# Patient Record
Sex: Male | Born: 1990 | Race: White | Hispanic: No | Marital: Single | Smoking: Never smoker
Health system: Southern US, Community
[De-identification: ages and names within clinical notes are randomized; demographics above are authoritative.]

## PROBLEM LIST (undated history)

## (undated) DIAGNOSIS — J45909 Unspecified asthma, uncomplicated: Secondary | ICD-10-CM

## (undated) HISTORY — DX: Unspecified asthma, uncomplicated: J45.909

---
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
## 2012-06-01 ENCOUNTER — Ambulatory Visit: Payer: BC Managed Care – PPO | Admitting: Emergency Medicine

## 2012-06-01 ENCOUNTER — Ambulatory Visit: Payer: BC Managed Care – PPO

## 2012-06-01 VITALS — BP 120/86 | HR 69 | Temp 98.3°F | Resp 16 | Ht 73.0 in | Wt 263.0 lb

## 2012-06-01 DIAGNOSIS — R079 Chest pain, unspecified: Secondary | ICD-10-CM

## 2012-06-01 DIAGNOSIS — J45909 Unspecified asthma, uncomplicated: Secondary | ICD-10-CM

## 2012-06-01 NOTE — Progress Notes (Signed)
[facility] [address], [place] [address] [phone]  Date:  06/01/2012   Name:  Johnny Butler   DOB:  Apr 01, 1991   MRN:  [id number]  PCP:  No primary provider on file.    Chief Complaint: tightness right side of throat and mid chest and Asthma   History of Present Illness:  Johnny Butler is a 21 y.o. very pleasant male patient who presents with the following:  Two days ago laying on the couch took a nap.  When he awakened, his throat was sore on the right side.  The pain migrated into his right anterior chest.  Two weeks ago was unable to stop wheezing with an MDI and gave himself a neb treatment.  Has a non productive cough.  No SOB, wheezing, nasal congestion, drip or drainage.  No fever or chills.  Says chest pain is pleuritic in nature.  No risk factors for PE; travel, immobilization.  Non smoker.  No unusual exertion.  There is no problem list on file for this patient.   No past medical history on file.  No past surgical history on file.  History  Substance Use Topics  . Smoking status: Never Smoker   . Smokeless tobacco: Not on file  . Alcohol Use: Yes    No family history on file.  No Known Allergies  Medication list has been reviewed and updated.  Current Outpatient Prescriptions on File Prior to Visit  Medication Sig Dispense Refill  . amphetamine-dextroamphetamine (ADDERALL) 10 MG tablet Take 10 mg by mouth 2 (two) times daily.        Review of Systems:  As per HPI, otherwise negative.    Physical Examination: Filed Vitals:   06/01/12 0947  BP: 120/86  Pulse: 69  Temp: 98.3 F (36.8 C)  Resp: 16   Filed Vitals:   06/01/12 0947  Height: 6\' 1"  (1.854 m)  Weight: 263 lb (119.296 kg)   Body mass index is 34.70 kg/(m^2). Ideal Body Weight: Weight in (lb) to have BMI = 25: 189.1   GEN: WDWN, NAD, Non-toxic, A & O x 3 HEENT: Atraumatic, Normocephalic. Neck supple. No masses, No LAD. Ears and Nose: No external  deformity. CV: RRR, No M/G/R. No JVD. No thrill. No extra heart sounds. PULM: CTA B, no wheezes, crackles, rhonchi. No retractions. No resp. distress. No accessory muscle use.  No chest wall tenderness, crepitus, flail or rub ABD: S, NT, ND, +BS. No rebound. No HSM. EXTR: No c/c/e NEURO Normal gait.  PSYCH: Normally interactive. Conversant. Not depressed or anxious appearing.  Calm demeanor.    Assessment and Plan: 1. Chest pain  DG Chest 2 View  2.  Asthma  Aleve OTC Follow up as needed   [facility] reading (PRIMARY) by  Dr. Dareen Piano.  Unusual density in anterior mediastinum.    Carmelina Dane, MD  Radiologist interpret the film is normal I have reviewed and agree with documentation. Robert P. Merla Riches, M.D.

## 2012-06-08 ENCOUNTER — Ambulatory Visit (INDEPENDENT_AMBULATORY_CARE_PROVIDER_SITE_OTHER): Payer: BC Managed Care – PPO | Admitting: Emergency Medicine

## 2012-06-08 VITALS — BP 138/88 | HR 112 | Temp 98.6°F | Resp 20 | Ht 74.0 in | Wt 257.8 lb

## 2012-06-08 DIAGNOSIS — J40 Bronchitis, not specified as acute or chronic: Secondary | ICD-10-CM

## 2012-06-08 DIAGNOSIS — J018 Other acute sinusitis: Secondary | ICD-10-CM

## 2012-06-08 MED ORDER — HYDROCOD POLST-CHLORPHEN POLST 10-8 MG/5ML PO LQCR: 5.0000 mL | Freq: Two times a day (BID) | ORAL | Status: DC | PRN

## 2012-06-08 MED ORDER — PSEUDOEPHEDRINE-GUAIFENESIN ER 60-600 MG PO TB12: 1.0000 | ORAL_TABLET | Freq: Two times a day (BID) | ORAL | Status: AC

## 2012-06-08 MED ORDER — AMOXICILLIN-POT CLAVULANATE 875-125 MG PO TABS: 1.0000 | ORAL_TABLET | Freq: Two times a day (BID) | ORAL | Status: DC

## 2012-06-08 NOTE — Progress Notes (Signed)
[facility] [address] [place] [address] [phone]  Date:  06/08/2012   Name:  Johnny Butler   DOB:  11/22/1990   MRN:  [id number]  PCP:  No primary provider on file.    Chief Complaint: Follow-up   History of Present Illness:  Johnny Butler is a 21 y.o. very pleasant male patient who presents with the following:  Seen 9/26 with cough and pleuritic chest pain.  Has not improved.  Says that he is in fact a bit worse and is concerned that he may have pleurisy.    Has nasal congestion and post nasal drainage with a foul taste with pain and pressure in maxillary sinuses. Moderate sore throat.  No fever or chills, nausea or vomiting.  No stool change. Cough not productive.  Some pleuritic pain.  Cough kept him awake until 0600.    There is no problem list on file for this patient.   No past medical history on file.  No past surgical history on file.  History  Substance Use Topics  . Smoking status: Never Smoker   . Smokeless tobacco: Not on file  . Alcohol Use: Yes     rarely    No family history on file.  No Known Allergies  Medication list has been reviewed and updated.  Current Outpatient Prescriptions on File Prior to Visit  Medication Sig Dispense Refill  . amphetamine-dextroamphetamine (ADDERALL) 10 MG tablet Take 10 mg by mouth 2 (two) times daily.        Review of Systems:  As per HPI, otherwise negative.    Physical Examination: Filed Vitals:   06/08/12 1551  BP: 138/88  Pulse: 112  Temp: 98.6 F (37 C)  Resp: 20   Filed Vitals:   06/08/12 1551  Height: 6\' 2"  (1.88 m)  Weight: 257 lb 12.8 oz (116.937 kg)   Body mass index is 33.10 kg/(m^2). Ideal Body Weight: Weight in (lb) to have BMI = 25: 194.3   GEN: WDWN, NAD, Non-toxic, A & O x 3.  No rash, sepsis or shortness of breath HEENT: Atraumatic, Normocephalic. Neck supple. No masses, No LAD.  Oropharynx negative Ears and Nose: No external deformity.  TM  clear CV: RRR, No M/G/R. No JVD. No thrill. No extra heart sounds. PULM: CTA B, no wheezes, crackles, rhonchi. No retractions. No resp. distress. No accessory muscle use. ABD: S, NT, ND, +BS. No rebound. No HSM. EXTR: No c/c/e NEURO Normal gait.  PSYCH: Normally interactive. Conversant. Not depressed or anxious appearing.  Calm demeanor.    Assessment and Plan: Sinusitis Bronchitis augmentin mucinex d tussionex Follow up as needed  Carmelina Dane, MD  I have reviewed and agree with documentation. Robert P. Merla Riches, M.D.

## 2013-10-01 ENCOUNTER — Ambulatory Visit: Payer: Managed Care, Other (non HMO)

## 2013-10-01 ENCOUNTER — Ambulatory Visit: Payer: Managed Care, Other (non HMO) | Admitting: Internal Medicine

## 2013-10-01 VITALS — BP 122/80 | HR 109 | Temp 98.2°F | Resp 18 | Wt 272.0 lb

## 2013-10-01 DIAGNOSIS — R079 Chest pain, unspecified: Secondary | ICD-10-CM

## 2013-10-01 DIAGNOSIS — J45909 Unspecified asthma, uncomplicated: Secondary | ICD-10-CM

## 2013-10-01 DIAGNOSIS — R059 Cough, unspecified: Secondary | ICD-10-CM

## 2013-10-01 DIAGNOSIS — R05 Cough: Secondary | ICD-10-CM

## 2013-10-01 MED ORDER — BENZONATATE 100 MG PO CAPS
100.0000 mg | ORAL_CAPSULE | Freq: Three times a day (TID) | ORAL | Status: AC | PRN
Start: 2013-10-01 — End: ?

## 2013-10-01 MED ORDER — ALBUTEROL SULFATE HFA 108 (90 BASE) MCG/ACT IN AERS: 1.0000 | INHALATION_SPRAY | Freq: Four times a day (QID) | RESPIRATORY_TRACT | Status: AC | PRN

## 2013-10-01 MED ORDER — HYDROCODONE-HOMATROPINE 5-1.5 MG/5ML PO SYRP: 5.0000 mL | ORAL_SOLUTION | Freq: Every evening | ORAL | Status: AC | PRN

## 2013-10-01 MED ORDER — ALBUTEROL SULFATE (2.5 MG/3ML) 0.083% IN NEBU: 2.5000 mg | INHALATION_SOLUTION | Freq: Four times a day (QID) | RESPIRATORY_TRACT | Status: AC | PRN

## 2013-10-01 MED ORDER — PREDNISONE 20 MG PO TABS: ORAL_TABLET | ORAL | Status: AC

## 2013-10-01 NOTE — Patient Instructions (Signed)
Use the albuterol (inhaler or nebulizer) every 4-6 hours for then next couple of days, then can return to as needed  Start the prednisone tomorrow morning - finish the full course as directed  Take the benzonatate (Tessalon Perles) every 8 hours as needed for cough   Use the Hycodan syrup if needed at bedtime - will probably make you sleepy  Drink plenty of fluids (water is best!) and get plenty of rest  Please let us know if any symptoms are worsening or not improving   Upper Respiratory Infection, Adult An upper respiratory infection (URI) is also sometimes known as the common cold. The upper respiratory tract includes the nose, sinuses, throat, trachea, and bronchi. Bronchi are the airways leading to the lungs. Most people improve within 1 week, but symptoms can last up to 2 weeks. A residual cough may last even longer.  CAUSES Many different viruses can infect the tissues lining the upper respiratory tract. The tissues become irritated and inflamed and often become very moist. Mucus production is also common. A cold is contagious. You can easily spread the virus to others by oral contact. This includes kissing, sharing a glass, coughing, or sneezing. Touching your mouth or nose and then touching a surface, which is then touched by another person, can also spread the virus. SYMPTOMS  Symptoms typically develop 1 to 3 days after you come in contact with a cold virus. Symptoms vary from person to person. They may include:  Runny nose.  Sneezing.  Nasal congestion.  Sinus irritation.  Sore throat.  Loss of voice (laryngitis).  Cough.  Fatigue.  Muscle aches.  Loss of appetite.  Headache.  Low-grade fever. DIAGNOSIS  You might diagnose your own cold based on familiar symptoms, since most people get a cold 2 to 3 times a year. Your caregiver can confirm this based on your exam. Most importantly, your caregiver can check that your symptoms are not due to another disease such  as strep throat, sinusitis, pneumonia, asthma, or epiglottitis. Blood tests, throat tests, and X-rays are not necessary to diagnose a common cold, but they may sometimes be helpful in excluding other more serious diseases. Your caregiver will decide if any further tests are required. RISKS AND COMPLICATIONS  You may be at risk for a more severe case of the common cold if you smoke cigarettes, have chronic heart disease (such as heart failure) or lung disease (such as asthma), or if you have a weakened immune system. The very young and very old are also at risk for more serious infections. Bacterial sinusitis, middle ear infections, and bacterial pneumonia can complicate the common cold. The common cold can worsen asthma and chronic obstructive pulmonary disease (COPD). Sometimes, these complications can require emergency medical care and may be life-threatening. PREVENTION  The best way to protect against getting a cold is to practice good hygiene. Avoid oral or hand contact with people with cold symptoms. Wash your hands often if contact occurs. There is no clear evidence that vitamin C, vitamin E, echinacea, or exercise reduces the chance of developing a cold. However, it is always recommended to get plenty of rest and practice good nutrition. TREATMENT  Treatment is directed at relieving symptoms. There is no cure. Antibiotics are not effective, because the infection is caused by a virus, not by bacteria. Treatment may include:  Increased fluid intake. Sports drinks offer valuable electrolytes, sugars, and fluids.  Breathing heated mist or steam (vaporizer or shower).  Eating chicken soup or other  clear broths, and maintaining good nutrition.  Getting plenty of rest.  Using gargles or lozenges for comfort.  Controlling fevers with ibuprofen or acetaminophen as directed by your caregiver.  Increasing usage of your inhaler if you have asthma. Zinc gel and zinc lozenges, taken in the first 24  hours of the common cold, can shorten the duration and lessen the severity of symptoms. Pain medicines may help with fever, muscle aches, and throat pain. A variety of non-prescription medicines are available to treat congestion and runny nose. Your caregiver can make recommendations and may suggest nasal or lung inhalers for other symptoms.  HOME CARE INSTRUCTIONS   Only take over-the-counter or prescription medicines for pain, discomfort, or fever as directed by your caregiver.  Use a warm mist humidifier or inhale steam from a shower to increase air moisture. This may keep secretions moist and make it easier to breathe.  Drink enough water and fluids to keep your urine clear or pale yellow.  Rest as needed.  Return to work when your temperature has returned to normal or as your caregiver advises. You may need to stay home longer to avoid infecting others. You can also use a face mask and careful hand washing to prevent spread of the virus. SEEK MEDICAL CARE IF:   After the first few days, you feel you are getting worse rather than better.  You need your caregiver's advice about medicines to control symptoms.  You develop chills, worsening shortness of breath, or brown or red sputum. These may be signs of pneumonia.  You develop yellow or brown nasal discharge or pain in the face, especially when you bend forward. These may be signs of sinusitis.  You develop a fever, swollen neck glands, pain with swallowing, or white areas in the back of your throat. These may be signs of strep throat. SEEK IMMEDIATE MEDICAL CARE IF:   You have a fever.  You develop severe or persistent headache, ear pain, sinus pain, or chest pain.  You develop wheezing, a prolonged cough, cough up blood, or have a change in your usual mucus (if you have chronic lung disease).  You develop sore muscles or a stiff neck. Document Released: 02/16/2001 Document Revised: 11/15/2011 Document Reviewed:  12/25/2010 [facility] Patient Information 2014 [place], [place].

## 2013-10-01 NOTE — Progress Notes (Signed)
Subjective:    Patient ID: Johnny Butler, male    DOB: [birth year], 23 y.o.   MRN: [id number]  HPI   Johnny Butler is a very pleasant 23 yr old male here with concern for illness.  Symptoms began 4 days ago.  Had some trouble breathing, felt like something in his right lung, like fluid - points to right anterior chest as he explains this.  Still has that discomfort.  He is coughing, non-productive.  Has nasal congestion, sore throat, laryngitis.  Feels feverish but has not had a documented fever.  He denies wheezing, but has felt SOB.  Right CP is worse with deep inspiration.  He does have a history of asthma - treated only with albuterol.  Uses albuterol rarely, usually only when sick.  Has run out and has not been able to use with this acute illness.  He is a nonsmoker.  No personal or family hx clot.  No recent travel.  No recent surgery.  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  Dayquil for symptoms, which helps somewhat   Review of Systems  Constitutional: Negative for fever and chills.  HENT: Positive for congestion, rhinorrhea and sore throat. Negative for ear pain.   Respiratory: Positive for cough and shortness of breath. Negative for wheezing.   Cardiovascular: Positive for chest pain.  Gastrointestinal: Negative.   Musculoskeletal: Negative.   Skin: Negative.        Objective:   Physical Exam  Vitals reviewed. Constitutional: He is oriented to person, place, and time. He appears well-developed and well-nourished. No distress.  HENT:  Head: Normocephalic and atraumatic.  Right Ear: Tympanic membrane and ear canal normal.  Left Ear: Tympanic membrane and ear canal normal.  Nose: Mucosal edema and rhinorrhea present. Right sinus exhibits no maxillary sinus tenderness and no frontal sinus tenderness. Left sinus exhibits no maxillary sinus tenderness and no frontal sinus tenderness.  Mouth/Throat: Uvula is midline, oropharynx is clear and moist and mucous membranes are  normal.  Eyes: Conjunctivae are normal. No scleral icterus.  Neck: Neck supple.  Cardiovascular: Normal rate, regular rhythm and normal heart sounds.   Pulmonary/Chest: Effort normal and breath sounds normal. He has no wheezes. He has no rales. He exhibits no tenderness.  Lymphadenopathy:    He has no cervical adenopathy.  Neurological: He is alert and oriented to person, place, and time.  Skin: Skin is warm and dry.  Psychiatric: He has a normal mood and affect. His behavior is normal.      [facility] reading (PRIMARY) by  Dr. Merla Riches - negative      Assessment & Plan:  Cough - Plan: DG Chest 2 View, albuterol (PROVENTIL HFA;VENTOLIN HFA) 108 (90 BASE) MCG/ACT inhaler, albuterol (PROVENTIL) (2.5 MG/3ML) 0.083% nebulizer solution, predniSONE (DELTASONE) 20 MG tablet, benzonatate (TESSALON) 100 MG capsule, HYDROcodone-homatropine (HYCODAN) 5-1.5 MG/5ML syrup  Chest pain - Plan: DG Chest 2 View  Asthma - Plan: DG Chest 2 View, albuterol (PROVENTIL HFA;VENTOLIN HFA) 108 (90 BASE) MCG/ACT inhaler, albuterol (PROVENTIL) (2.5 MG/3ML) 0.083% nebulizer solution, predniSONE (DELTASONE) 20 MG tablet   Johnny Butler is a very pleasant 23 yr old male here with cough/cp/sob and associated URI symptoms in the setting of underlying asthma.  Afebrile, SpO2 100%, lungs CTA.  Exam very reassuring.  Xray negative.  Suspect viral URI exacerbating asthma.  Will refill albuterol today - encouraged him to use scheduled for the next 2-3 days.  Tessalon and Hycodan prn cough.  Prednisone taper.  Work note provided.  If any symptoms worsening or not improving, pt to RTC.   Loleta DickerE. Elizabeth Alicia Ackert MHS, PA-C [facility] 1/27/20158:36 AM   I agree with the patient encounter in its entirety as documented.Harrel Lemon. Robert P. Merla Richesoolittle, M.D.

## 2014-11-03 ENCOUNTER — Encounter (HOSPITAL_BASED_OUTPATIENT_CLINIC_OR_DEPARTMENT_OTHER): Payer: Self-pay | Admitting: *Deleted

## 2014-11-03 ENCOUNTER — Emergency Department (HOSPITAL_BASED_OUTPATIENT_CLINIC_OR_DEPARTMENT_OTHER)
Admission: EM | Admit: 2014-11-03 | Discharge: 2014-11-03 | Disposition: A | Discharge status: Home / Self Care | Payer: Self-pay | Attending: Emergency Medicine | Admitting: Emergency Medicine

## 2014-11-03 DIAGNOSIS — Z7952 Long term (current) use of systemic steroids: Secondary | ICD-10-CM | POA: Insufficient documentation

## 2014-11-03 DIAGNOSIS — Z79899 Other long term (current) drug therapy: Secondary | ICD-10-CM | POA: Insufficient documentation

## 2014-11-03 DIAGNOSIS — M722 Plantar fascial fibromatosis: Secondary | ICD-10-CM | POA: Insufficient documentation

## 2014-11-03 DIAGNOSIS — J45909 Unspecified asthma, uncomplicated: Secondary | ICD-10-CM | POA: Insufficient documentation

## 2014-11-03 MED ORDER — OXYCODONE-ACETAMINOPHEN 5-325 MG PO TABS: 1.0000 | ORAL_TABLET | Freq: Four times a day (QID) | ORAL | Status: AC | PRN

## 2014-11-03 NOTE — Discharge Instructions (Signed)
Plantar Fasciitis  Plantar fasciitis is a common condition that causes foot pain. It is soreness (inflammation) of the band of tough fibrous tissue on the bottom of the foot that runs from the heel bone (calcaneus) to the ball of the foot. The cause of this soreness may be from excessive standing, poor fitting shoes, running on hard surfaces, being overweight, having an abnormal walk, or overuse (this is common in runners) of the painful foot or feet. It is also common in aerobic exercise dancers and ballet dancers.  SYMPTOMS   Most people with plantar fasciitis complain of:   Severe pain in the morning on the bottom of their foot especially when taking the first steps out of bed. This pain recedes after a few minutes of walking.   Severe pain is experienced also during walking following a long period of inactivity.   Pain is worse when walking barefoot or up stairs  DIAGNOSIS    Your caregiver will diagnose this condition by examining and feeling your foot.   Special tests such as X-rays of your foot, are usually not needed.  PREVENTION    Consult a sports medicine professional before beginning a new exercise program.   Walking programs offer a good workout. With walking there is a lower chance of overuse injuries common to runners. There is less impact and less jarring of the joints.   Begin all new exercise programs slowly. If problems or pain develop, decrease the amount of time or distance until you are at a comfortable level.   Wear good shoes and replace them regularly.   Stretch your foot and the heel cords at the back of the ankle (Achilles tendon) both before and after exercise.   Run or exercise on even surfaces that are not hard. For example, asphalt is better than pavement.   Do not run barefoot on hard surfaces.   If using a treadmill, vary the incline.   Do not continue to workout if you have foot or joint problems. Seek professional help if they do not improve.  HOME CARE INSTRUCTIONS     Avoid activities that cause you pain until you recover.   Use ice or cold packs on the problem or painful areas after working out.   Only take over-the-counter or prescription medicines for pain, discomfort, or fever as directed by your caregiver.   Soft shoe inserts or athletic shoes with air or gel sole cushions may be helpful.   If problems continue or become more severe, consult a sports medicine caregiver or your own health care provider. Cortisone is a potent anti-inflammatory medication that may be injected into the painful area. You can discuss this treatment with your caregiver.  MAKE SURE YOU:    Understand these instructions.   Will watch your condition.   Will get help right away if you are not doing well or get worse.  Document Released: 05/18/2001 Document Revised: 11/15/2011 Document Reviewed: 07/17/2008  [employer] Patient Information 2015 [employer]. This information is not intended to replace advice given to you by your health care provider. Make sure you discuss any questions you have with your health care provider.

## 2014-11-03 NOTE — ED Provider Notes (Signed)
CSN: [id number]     Arrival date & time 11/03/14  1634 History  This chart was scribed for [employer]. Johnny Payor, Johnny Butler by Tonye Royalty, ED Scribe. This patient was seen in room MHFT1/MHFT1 and the patient's care was started at 6:19 PM.    Chief Complaint  Patient presents with  . Foot Pain   The history is provided by the patient. No language interpreter was used.    HPI Comments: Johnny Butler is a 24 y.o. male who presents to the Emergency Department complaining of pain to bottom of left foot with onset yesterday, increasing progressively. He states he could ambulate yesterday but today cannot due to pain. He notes he has recently been going to the gym more frequently and states his shoes are tight. He denies exertion or injury. He denies prior problems with left foot except a blister. He states he has been using some Ibuprofen with small improvement.  Past Medical History  Diagnosis Date  . Asthma    History reviewed. No pertinent past surgical history. Family History  Problem Relation Age of Onset  . Hypertension Mother    History  Substance Use Topics  . Smoking status: Never Smoker   . Smokeless tobacco: Not on file  . Alcohol Use: Yes     Comment: rarely    Review of Systems  Constitutional: Negative for fever.  Musculoskeletal:       Left foot pain  Skin: Positive for wound. Negative for rash.  Neurological: Negative for numbness.      Allergies  Review of patient's allergies indicates no known allergies.  Home Medications   Prior to Admission medications   Medication Sig Start Date End Date Taking? Authorizing Provider  sertraline (ZOLOFT) 50 MG tablet Take 50 mg by mouth daily.   Yes Historical Provider, Johnny Butler  albuterol (PROVENTIL HFA;VENTOLIN HFA) 108 (90 BASE) MCG/ACT inhaler Inhale 1-2 puffs into the lungs every 6 (six) hours as needed for wheezing or shortness of breath. 10/01/13   Godfrey Pick, PA-C  albuterol (PROVENTIL) (2.5 MG/3ML) 0.083% nebulizer  solution Take 3 mLs (2.5 mg total) by nebulization every 6 (six) hours as needed for wheezing or shortness of breath. 10/01/13   Eleanore Delia Chimes, PA-C  amphetamine-dextroamphetamine (ADDERALL) 10 MG tablet Take 10 mg by mouth 2 (two) times daily.    Historical Provider, Johnny Butler  benzonatate (TESSALON) 100 MG capsule Take 1-2 capsules (100-200 mg total) by mouth 3 (three) times daily as needed for cough. 10/01/13   Eleanore Delia Chimes, PA-C  HYDROcodone-homatropine (HYCODAN) 5-1.5 MG/5ML syrup Take 5 mLs by mouth at bedtime as needed for cough. 10/01/13   Eleanore Delia Chimes, PA-C  oxyCODONE-acetaminophen (PERCOCET/ROXICET) 5-325 MG per tablet Take 1-2 tablets by mouth every 6 (six) hours as needed for severe pain. 11/03/14   Juliet Rude. Johnny Payor, Johnny Butler  predniSONE (DELTASONE) 20 MG tablet Take 3 PO QAM x3days, 2 PO QAM x3days, 1 PO QAM x3days 10/01/13   Eleanore E Egan, PA-C   BP 145/83 mmHg  Pulse 85  Temp(Src) 98.4 F (36.9 C)  Resp 18  Ht  (1.905 m)  Wt 200 lb (90.719 kg)  BMI 25.00 kg/m2  SpO2 96% Physical Exam  Constitutional: He is oriented to person, place, and time. He appears well-developed and well-nourished.  HENT:  Head: Normocephalic.  Musculoskeletal: Normal range of motion.  tenderness over the anterior arch  No bony tenderness on palpation of top of foot  Neurological: He is alert and oriented to  person, place, and time.  Skin: Skin is warm and dry.  No skin changes No fluctuance Cracked area of skin between great and second toe but no drainage, no erythema  Psychiatric: He has a normal mood and affect.  Nursing note and vitals reviewed.   ED Course  Procedures (including critical care time)   COORDINATION OF CARE: 6:26 PM Discussed with patient that I suspect plantar fasciitis. Discussed treatment plan with patient at beside, including anti-inflammatory, pain medication, and rest, and that x-ray is unlikely to reveal significant findings. The patient agrees with the plan and has  no further questions at this time.  Labs Review Labs Reviewed - No data to display  Imaging Review No results found.   EKG Interpretation None      MDM   Final diagnoses:  Plantar fasciitis of left foot    Patient with foot pain. Tenderness over anterior arch. May be a plantar fasciitis. No trauma but patient has been going to the gym more. No need for x-ray. Patient was given some instructions and pain medicines. She will follow-up as needed.  I personally performed the services described in this documentation, which was scribed in my presence. The recorded information has been reviewed and is accurate.    Juliet RudeNathan R. Johnny PayorPickering, Johnny Butler 11/03/14 [phone]

## 2014-11-03 NOTE — ED Notes (Signed)
Left foot pain along bottom/ arch. He denies injury, swelling, or bruising

## 2017-12-03 ENCOUNTER — Emergency Department (HOSPITAL_COMMUNITY)
Admission: EM | Admit: 2017-12-03 | Discharge: 2017-12-03 | Disposition: A | Discharge status: Home / Self Care | Payer: BLUE CROSS/BLUE SHIELD | Attending: Emergency Medicine | Admitting: Emergency Medicine

## 2017-12-03 ENCOUNTER — Other Ambulatory Visit: Payer: Self-pay

## 2017-12-03 ENCOUNTER — Emergency Department (HOSPITAL_COMMUNITY): Payer: BLUE CROSS/BLUE SHIELD

## 2017-12-03 ENCOUNTER — Encounter (HOSPITAL_COMMUNITY): Payer: Self-pay | Admitting: Emergency Medicine

## 2017-12-03 DIAGNOSIS — R6884 Jaw pain: Secondary | ICD-10-CM | POA: Diagnosis not present

## 2017-12-03 DIAGNOSIS — K0889 Other specified disorders of teeth and supporting structures: Secondary | ICD-10-CM | POA: Diagnosis not present

## 2017-12-03 DIAGNOSIS — R079 Chest pain, unspecified: Secondary | ICD-10-CM | POA: Diagnosis not present

## 2017-12-03 DIAGNOSIS — Z5321 Procedure and treatment not carried out due to patient leaving prior to being seen by health care provider: Secondary | ICD-10-CM | POA: Diagnosis not present

## 2017-12-03 LAB — BASIC METABOLIC PANEL
Anion gap: 10 (ref 5–15)
BUN: 6 mg/dL (ref 6–20)
CALCIUM: 9.2 mg/dL (ref 8.9–10.3)
CO2: 26 mmol/L (ref 22–32)
Chloride: 100 mmol/L — ABNORMAL LOW (ref 101–111)
Creatinine, Ser: 0.82 mg/dL (ref 0.61–1.24)
GFR calc Af Amer: >60 mL/min (ref >60)
GFR calc non Af Amer: >60 mL/min (ref >60)
GLUCOSE: 110 mg/dL — AB (ref 65–99)
Potassium: 3.4 mmol/L — ABNORMAL LOW (ref 3.5–5.1)
Sodium: 136 mmol/L (ref 135–145)

## 2017-12-03 LAB — I-STAT TROPONIN, ED: TROPONIN I, POC: 0 ng/mL (ref 0.00–0.08)

## 2017-12-03 LAB — CBC
HEMATOCRIT: 41.6 % (ref 39.0–52.0)
Hemoglobin: 14.2 g/dL (ref 13.0–17.0)
MCH: 28.7 pg (ref 26.0–34.0)
MCHC: 34.1 g/dL (ref 30.0–36.0)
MCV: 84.2 fL (ref 78.0–100.0)
Platelets: 402 10*3/uL — ABNORMAL HIGH (ref 150–400)
RBC: 4.94 MIL/uL (ref 4.22–5.81)
RDW: 12.9 % (ref 11.5–15.5)
WBC: 11.7 10*3/uL — ABNORMAL HIGH (ref 4.0–10.5)

## 2017-12-03 NOTE — ED Notes (Addendum)
Called triage RN about pain meds was told pt had to wait to see Dr. Rock NephewPt informed

## 2017-12-03 NOTE — ED Notes (Signed)
Pt states that he came in for the pain and due to the delay he was just going to go home.

## 2017-12-03 NOTE — ED Triage Notes (Signed)
Reports left lower dental abscess for 2 years.  States pain is worse over the past few days.  Pain is now radiating into his L jaw and L side of neck.  Pt concerned over radiation into neck due to 2 hour episode of pain to center of chest 2 weeks ago.  Denies chest pain since then.  States he feels lightheaded.

## 2018-01-29 ENCOUNTER — Other Ambulatory Visit: Payer: Self-pay

## 2018-01-29 ENCOUNTER — Emergency Department (HOSPITAL_BASED_OUTPATIENT_CLINIC_OR_DEPARTMENT_OTHER): Payer: BLUE CROSS/BLUE SHIELD

## 2018-01-29 ENCOUNTER — Emergency Department (HOSPITAL_BASED_OUTPATIENT_CLINIC_OR_DEPARTMENT_OTHER)
Admission: EM | Admit: 2018-01-29 | Discharge: 2018-01-29 | Disposition: A | Discharge status: Home / Self Care | Payer: BLUE CROSS/BLUE SHIELD | Attending: Emergency Medicine | Admitting: Emergency Medicine

## 2018-01-29 ENCOUNTER — Encounter (HOSPITAL_BASED_OUTPATIENT_CLINIC_OR_DEPARTMENT_OTHER): Payer: Self-pay | Admitting: Emergency Medicine

## 2018-01-29 DIAGNOSIS — R0789 Other chest pain: Secondary | ICD-10-CM | POA: Diagnosis not present

## 2018-01-29 DIAGNOSIS — Z79899 Other long term (current) drug therapy: Secondary | ICD-10-CM | POA: Diagnosis not present

## 2018-01-29 DIAGNOSIS — J45909 Unspecified asthma, uncomplicated: Secondary | ICD-10-CM | POA: Diagnosis not present

## 2018-01-29 DIAGNOSIS — R002 Palpitations: Secondary | ICD-10-CM | POA: Diagnosis present

## 2018-01-29 LAB — D-DIMER, QUANTITATIVE (NOT AT ARMC): D DIMER QUANT: 0.27 ug{FEU}/mL (ref 0.00–0.50)

## 2018-01-29 NOTE — Discharge Instructions (Addendum)
Your blood work, chest xray and ekg were reassuring.   Please establish care with a primary doctor, you can use phone number attached to this paperwork to find one accepting patients in the area.   Return to the ER for any new or concerning symptoms like worsening chest pain, chest pain that radiates to the left arm or jaw, trouble breathing, chest pain that makes you sweat, chest pain with vomiting.

## 2018-01-29 NOTE — ED Triage Notes (Signed)
Patient states that just after he woke up this am he started to have a feeling of his "heart pumin , exaggerated" and has some chest pain

## 2018-01-29 NOTE — ED Provider Notes (Signed)
[facility] EMERGENCY DEPARTMENT Provider Note   CSN: [id number] Arrival date & time: 01/29/18  1251     History   Chief Complaint Chief Complaint  Patient presents with  . Chest Pain    HPI Johnny Butler is a 27 y.o. male.  HPI  Patient is a 27yo male with a history of asthma and hypertension who presents to the emergency department for evaluation of palpitations and chest pain.  Patient reports that his symptoms began this morning when he woke up around 9 AM.  He reports that he can feel his heart beating in his chest.  Denies heart racing or feelings of skipped beats.  States that he also has intermittent left-sided "very mild" nonradiating chest pain.  He states that pain feels sharp.  Denies any known trigger to pain, states that it is not positional.  Has not taken any over-the-counter medications for his symptoms.  Denies any recent trauma to the chest or strenuous physical activity.  Denies recreational drug use.  Reports that he has never smoked cigarettes or use tobacco.  His maternal grandmother had a heart attack in her 79s, no other known family history of ischemic heart disease.  He denies fevers, chills, cough, congestion, wheezing, shortness of breath, abdominal pain, nausea/vomiting, diaphoresis, lightheadedness or syncope.  Denies history of DVT/PE, recent surgeries or immobilization, pleuritic chest pain, active cancer.  He does have some mild left upper leg pain which he attributed to straining a few days ago, has been taking ibuprofen for this.  Denies leg swelling.  Past Medical History:  Diagnosis Date  . Asthma     There are no active problems to display for this patient.   History reviewed. No pertinent surgical history.      Home Medications    Prior to Admission medications   Medication Sig Start Date End Date Taking? Authorizing Provider  albuterol (PROVENTIL HFA;VENTOLIN HFA) 108 (90 BASE) MCG/ACT inhaler Inhale 1-2 puffs into the lungs  every 6 (six) hours as needed for wheezing or shortness of breath. 10/01/13   Elsie Stain E, PA-C  albuterol (PROVENTIL) (2.5 MG/3ML) 0.083% nebulizer solution Take 3 mLs (2.5 mg total) by nebulization every 6 (six) hours as needed for wheezing or shortness of breath. 10/01/13   Elsie Stain E, PA-C  amphetamine-dextroamphetamine (ADDERALL) 10 MG tablet Take 10 mg by mouth 2 (two) times daily.    [provider]  benzonatate (TESSALON) 100 MG capsule Take 1-2 capsules (100-200 mg total) by mouth 3 (three) times daily as needed for cough. 10/01/13   Godfrey Pick, PA-C  HYDROcodone-homatropine (HYCODAN) 5-1.5 MG/5ML syrup Take 5 mLs by mouth at bedtime as needed for cough. 10/01/13   Godfrey Pick, PA-C  oxyCODONE-acetaminophen (PERCOCET/ROXICET) 5-325 MG per tablet Take 1-2 tablets by mouth every 6 (six) hours as needed for severe pain. 11/03/14   Benjiman Core, MD  predniSONE (DELTASONE) 20 MG tablet Take 3 PO QAM x3days, 2 PO QAM x3days, 1 PO QAM x3days 10/01/13   Elsie Stain E, PA-C  sertraline (ZOLOFT) 50 MG tablet Take 50 mg by mouth daily.    [provider]    Family History Family History  Problem Relation Age of Onset  . Hypertension Mother     Social History Social History   Tobacco Use  . Smoking status: Never Smoker  . Smokeless tobacco: Never Used  Substance Use Topics  . Alcohol use: Yes    Comment: rarely  . Drug use: No  Types: Marijuana     Allergies   Patient has no known allergies.   Review of Systems Review of Systems  Constitutional: Negative for chills, diaphoresis and fever.  HENT: Negative for congestion.   Respiratory: Negative for cough, shortness of breath and wheezing.   Cardiovascular: Positive for chest pain and palpitations. Negative for leg swelling.  Gastrointestinal: Negative for abdominal pain, nausea and vomiting.  Genitourinary: Negative for difficulty urinating.  Musculoskeletal: Negative for back pain.    Skin: Negative for rash.  Neurological: Negative for weakness and numbness.  Psychiatric/Behavioral: Negative for agitation.     Physical Exam Updated Vital Signs BP 126/83   Pulse 83   Temp 98.4 F (36.9 C) (Oral)   Resp 16   Ht  (1.905 m)   Wt (!) 145.2 kg (320 lb)   SpO2 96%   BMI 40.00 kg/m   Physical Exam  Constitutional: He is oriented to person, place, and time. He appears well-developed and well-nourished. No distress.  Sitting at bedside in no apparent distress, nontoxic-appearing.  HENT:  Head: Normocephalic and atraumatic.  Mouth/Throat: Oropharynx is clear and moist. No oropharyngeal exudate.  Eyes: Pupils are equal, round, and reactive to light. Conjunctivae are normal. Right eye exhibits no discharge. Left eye exhibits no discharge.  Neck: Normal range of motion. Neck supple. No JVD present. No tracheal deviation present.  Cardiovascular: Normal rate, regular rhythm and intact distal pulses.  No murmur heard. Pulmonary/Chest: Effort normal and breath sounds normal. No stridor. No respiratory distress. He has no wheezes. He has no rales.  No rashes or bruising noted over the anterior chest wall. Anterior chest wall nontender to palpation.   Abdominal: Soft. Bowel sounds are normal. There is no tenderness. There is no guarding.  Musculoskeletal:  No leg swelling or calf tenderness.   Neurological: He is alert and oriented to person, place, and time. Coordination normal.  Skin: Skin is warm and dry. He is not diaphoretic.  Psychiatric: He has a normal mood and affect. His behavior is normal.  Nursing note and vitals reviewed.    ED Treatments / Results  Labs (all labs ordered are listed, but only abnormal results are displayed) Labs Reviewed  D-DIMER, QUANTITATIVE (NOT AT [facility])    EKG EKG Interpretation  Date/Time:  Sunday Jan 29 2018 12:56:10 EDT Ventricular Rate:  99 PR Interval:  144 QRS Duration: 102 QT Interval:  342 QTC  Calculation: 438 R Axis:   60 Text Interpretation:  Normal sinus rhythm Normal ECG new flipped t wave in lead III Otherwise no significant change Confirmed by Melene Plan [phone]) on 01/29/2018 1:33:48 PM   Radiology Dg Chest 2 View  Result Date: 01/29/2018 CLINICAL DATA:  Patient states that just after he woke up this am he started to have a feeling of his "heart pumin , exaggerated" and has some chest pain , hx asthma EXAM: CHEST - 2 VIEW COMPARISON:  12/03/2017 FINDINGS: The heart size and mediastinal contours are within normal limits. Both lungs are clear. No pleural effusion or pneumothorax. The visualized skeletal structures are unremarkable. IMPRESSION: Normal chest radiographs. Electronically Signed   By: Amie Portland M.D.   On: 01/29/2018 13:45    Procedures Procedures (including critical care time)  Medications Ordered in ED Medications - No data to display   Initial Impression / Assessment and Plan / ED Course  I have reviewed the triage vital signs and the nursing notes.  Pertinent labs & imaging results that were available during  my care of the patient were reviewed by me and considered in my medical decision making (see chart for details).      Patient is to be discharged with recommendation to follow up with PCP in regards to today's hospital visit. EKG nonischemic, CXR without acute abnormality. No pneumothorax, pneumomediastinum, pneumonia and heart size within normal. Do not suspect ACS given patient's age, low risk factors and presentation. Do not think troponin is indicated at this time and discussed this with Dr. Adela Lank who agrees. He was initially tachycardic and reports some vague pain in left leg, therefore got ddimer to further evaluate for PE which is negative and doubt PE given lab result. Pain does not radiate to the back, no pulse deficits, no neurological symptoms and do not suspect aortic dissection. Patient afebrile and NAD, doubt esophageal perforation.  Pt has  been advised to return to the ED if CP becomes exertional, associated with diaphoresis or nausea, radiates to left jaw/arm, worsens or becomes concerning in any way. Pt appears reliable for follow up and is agreeable to discharge.   Final Clinical Impressions(s) / ED Diagnoses   Final diagnoses:  Atypical chest pain    ED Discharge Orders    None       Kellie Shropshire, PA-C 01/29/18 1613    Melene Plan, DO 01/30/18 [phone]

## 2018-08-05 ENCOUNTER — Other Ambulatory Visit: Payer: Self-pay | Admitting: Cardiology

## 2018-11-29 IMAGING — DX DG CHEST 2V
2 series · 2 of 2 positions shown · non-contrast
Comparison: Chest radiograph dated 10/01/2013

CLINICAL DATA: 26-year-old male with chest pain.

EXAM:
CHEST - 2 VIEW

[chest pa]
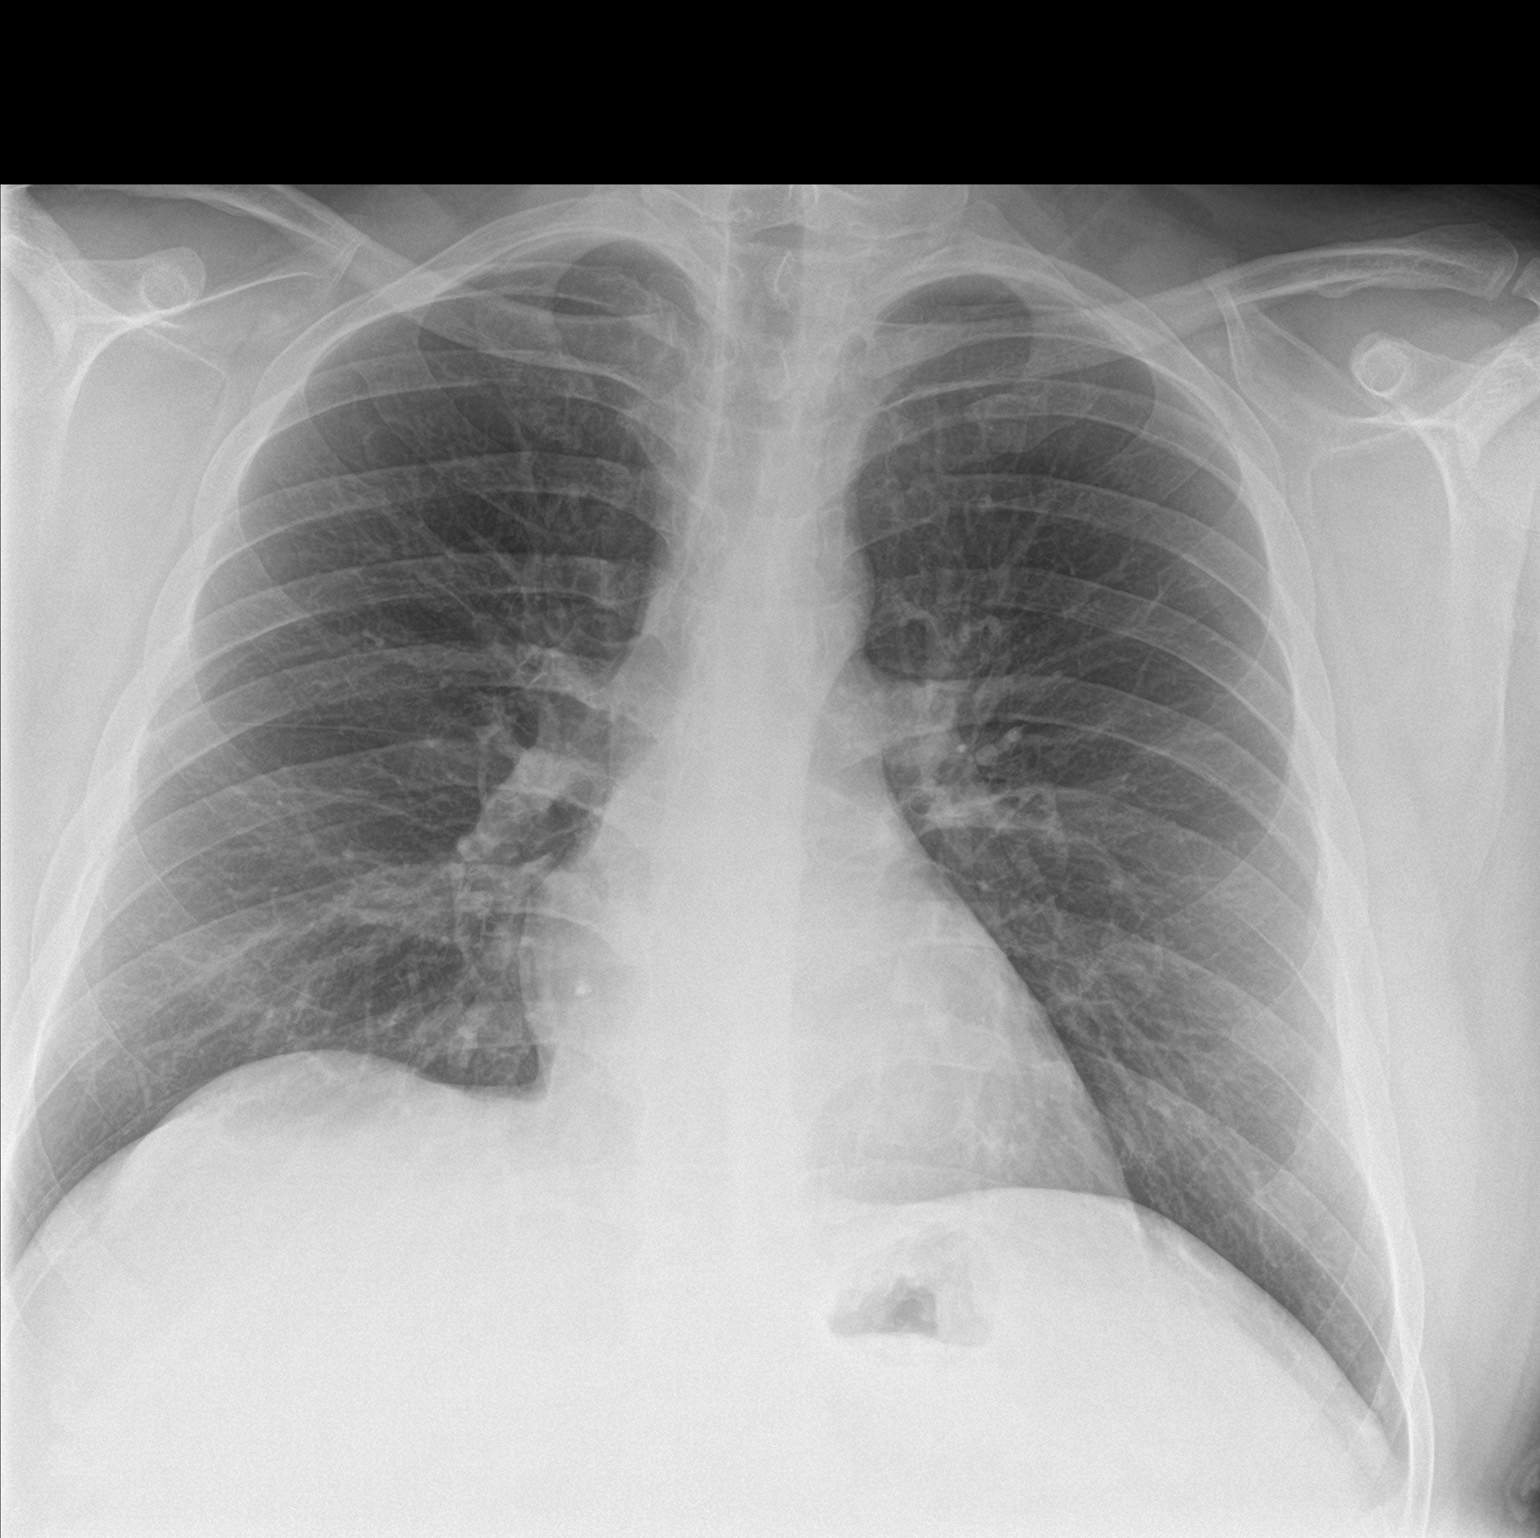

[chest lat]
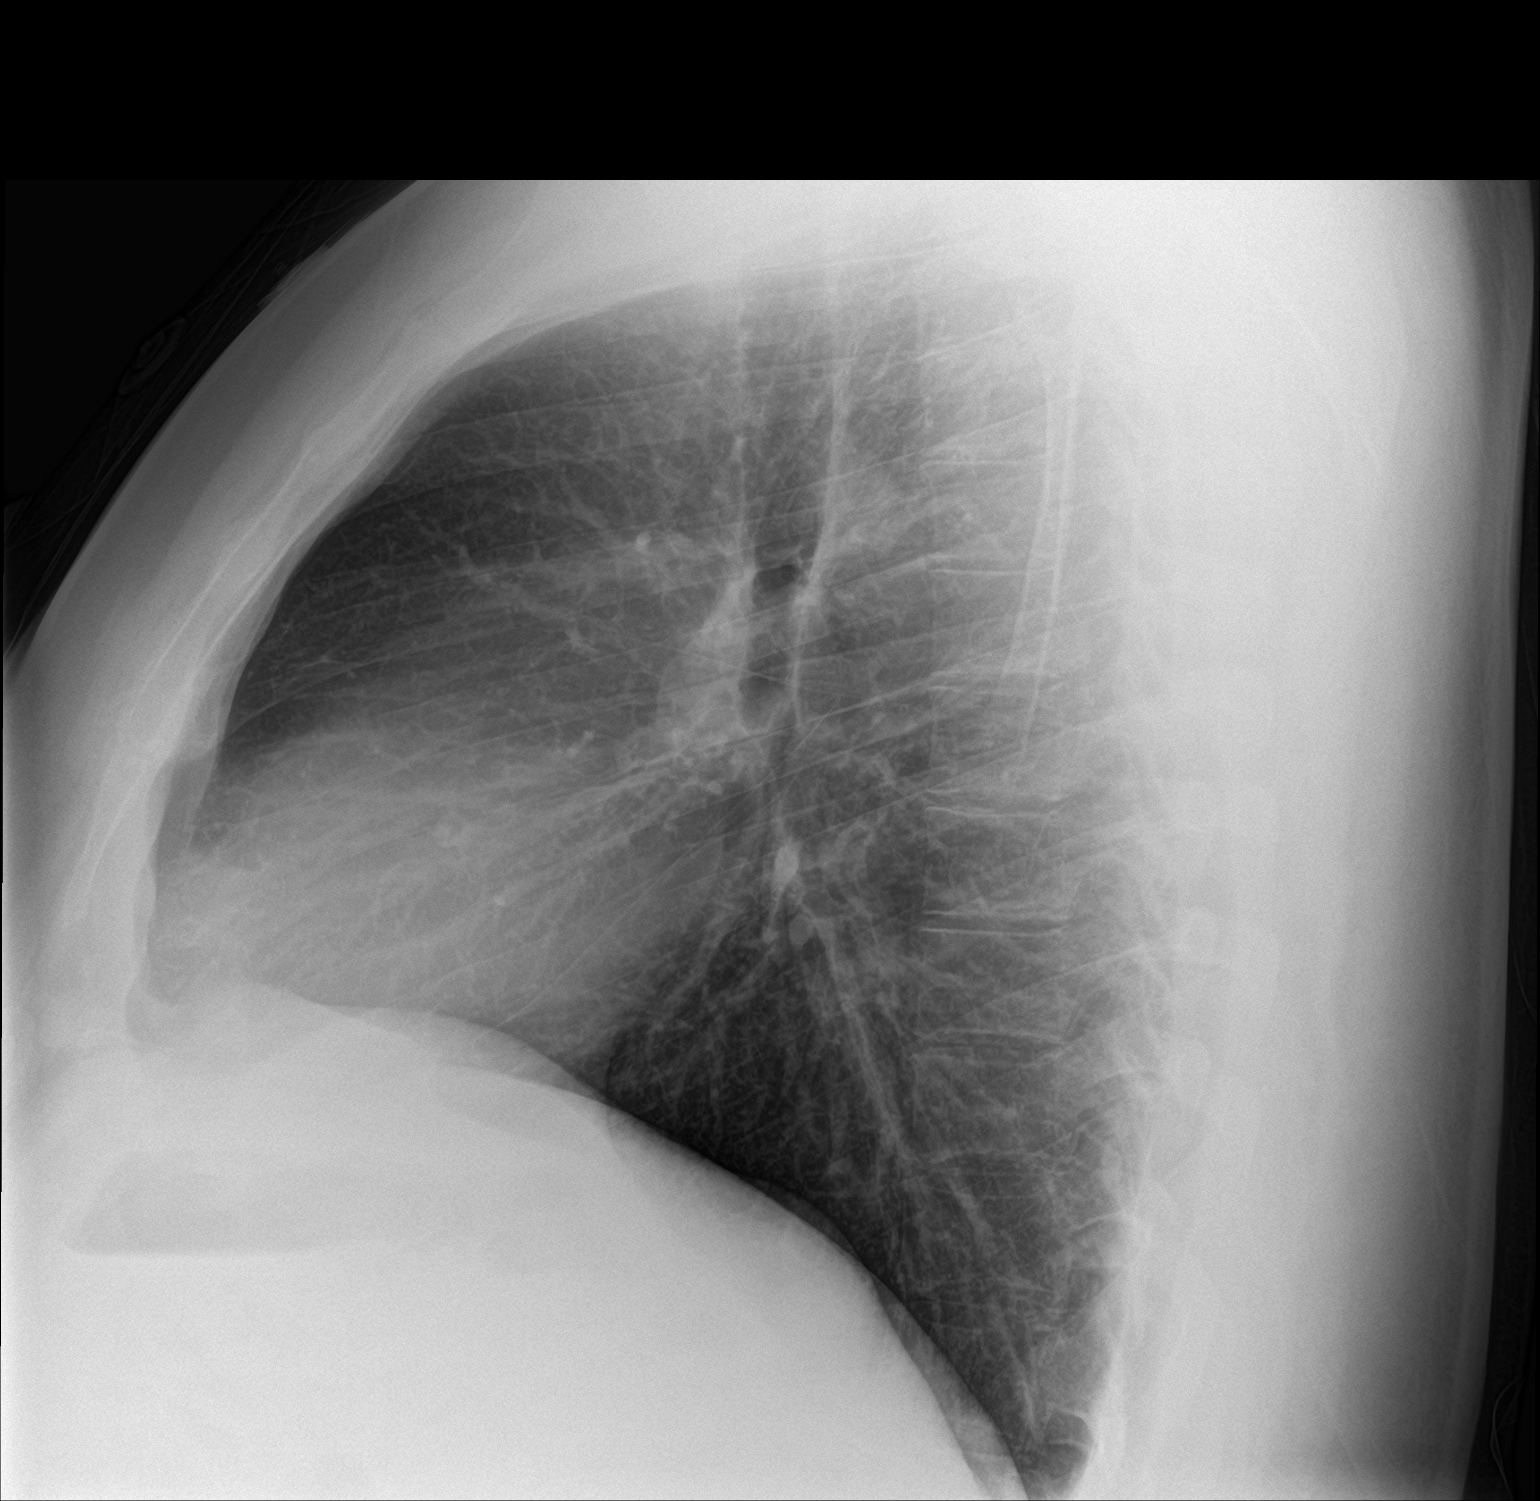

[2 of 2 positions shown; findings below may reference images not displayed]

FINDINGS: The heart size and mediastinal contours are within normal limits.
Both lungs are clear. The visualized skeletal structures are
unremarkable.
IMPRESSION: No active cardiopulmonary disease.

## 2019-01-25 IMAGING — CR DG CHEST 2V
3 series · 3 of 3 positions shown · non-contrast
Comparison: 12/03/2017

CLINICAL DATA: Patient states that just after he woke up this am he
started to have a feeling of his "heart pumin , exaggerated" and has
some chest pain , hx asthma

EXAM:
CHEST - 2 VIEW

[w chest pa]
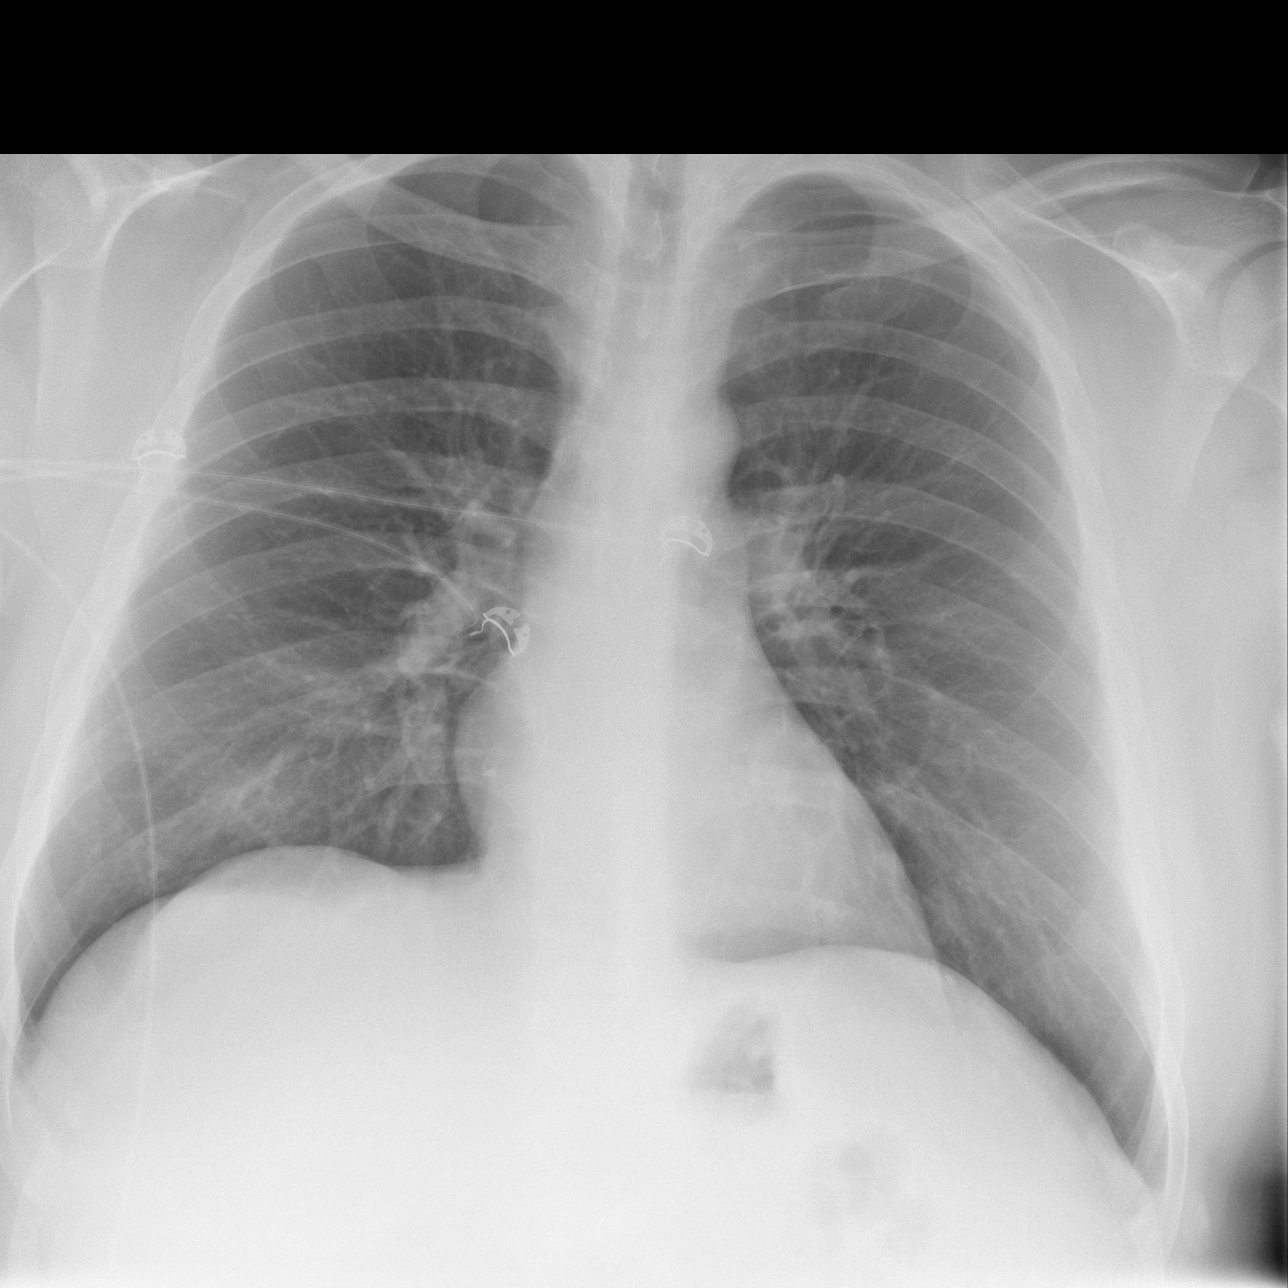

[w chest lat (1 of 2)]
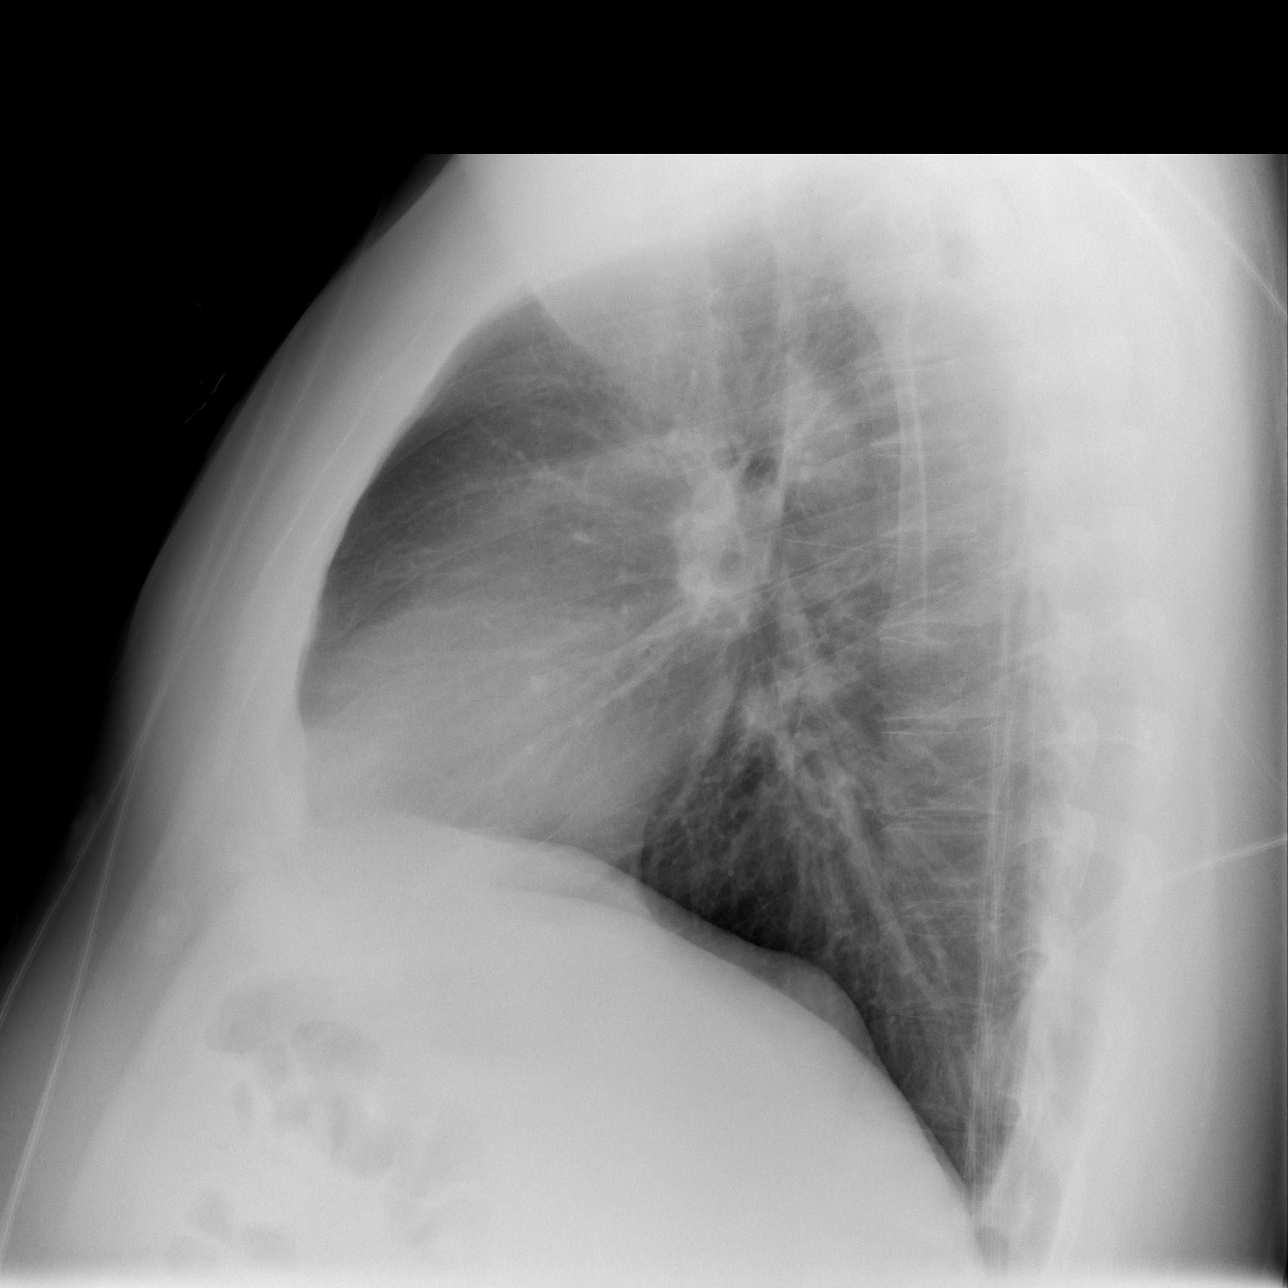

[w chest lat (2 of 2)]
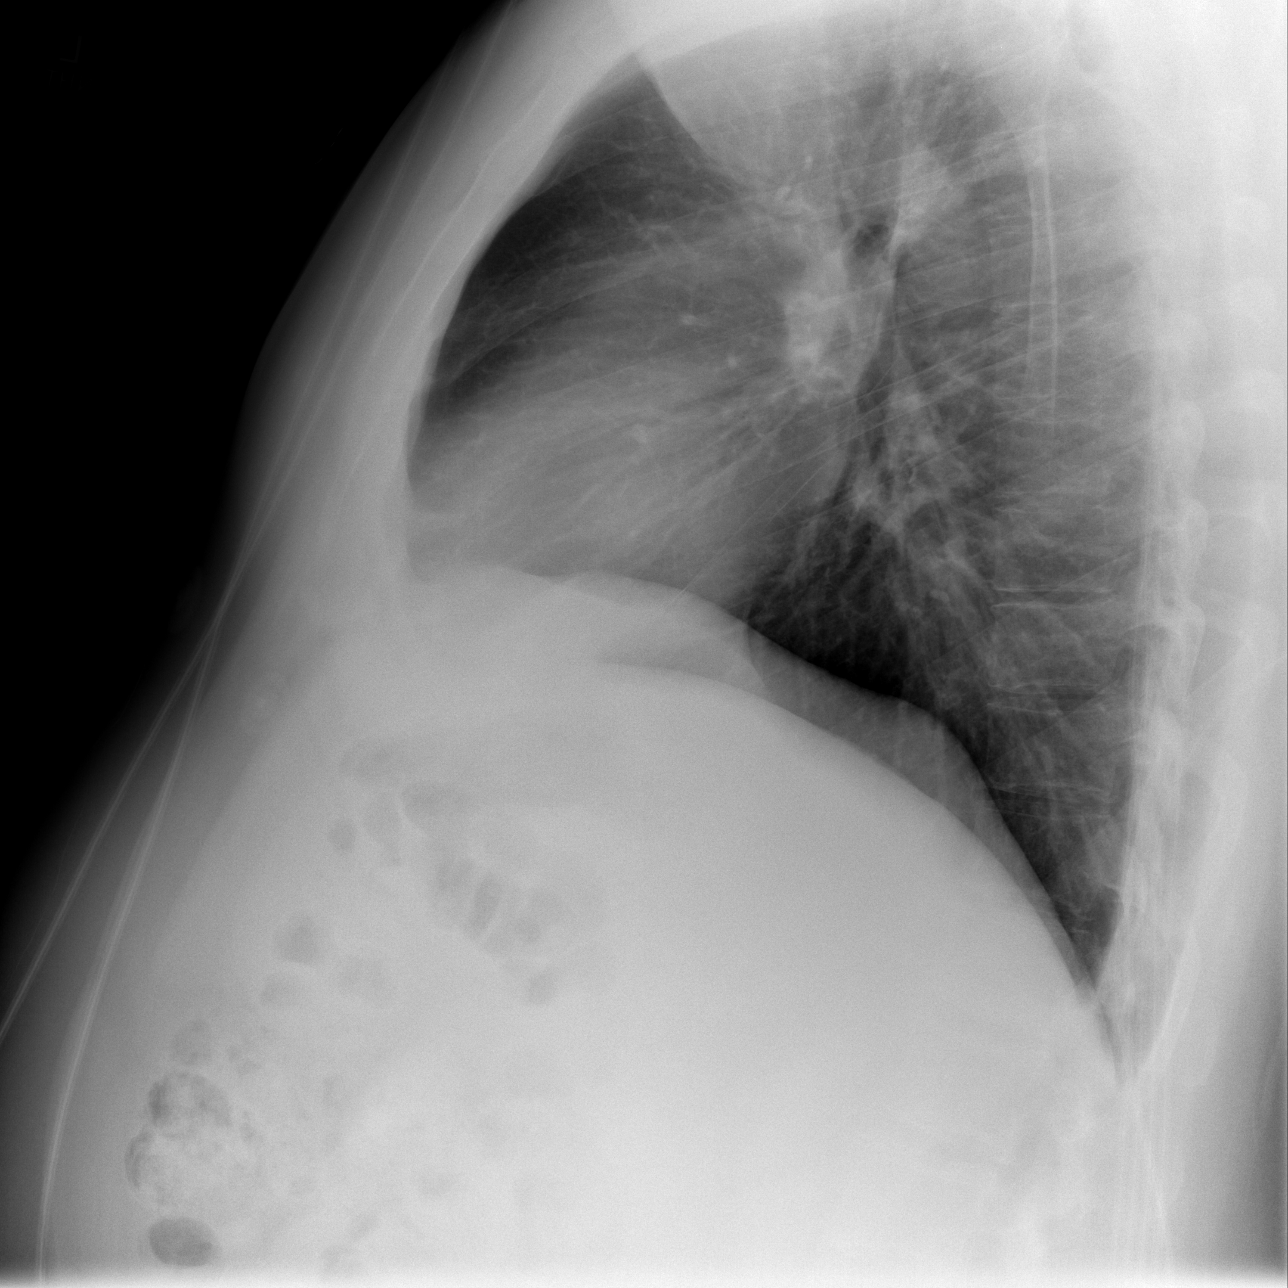

[3 of 3 positions shown; findings below may reference images not displayed]

FINDINGS: The heart size and mediastinal contours are within normal limits.
Both lungs are clear. No pleural effusion or pneumothorax. The
visualized skeletal structures are unremarkable.
IMPRESSION: Normal chest radiographs.

## 2023-07-07 ENCOUNTER — Other Ambulatory Visit: Payer: Self-pay

## 2023-07-07 ENCOUNTER — Emergency Department (HOSPITAL_BASED_OUTPATIENT_CLINIC_OR_DEPARTMENT_OTHER)
Admission: EM | Admit: 2023-07-07 | Discharge: 2023-07-08 | Disposition: A | Discharge status: Home / Self Care | Payer: Managed Care, Other (non HMO) | Attending: Emergency Medicine | Admitting: Emergency Medicine

## 2023-07-07 ENCOUNTER — Encounter (HOSPITAL_BASED_OUTPATIENT_CLINIC_OR_DEPARTMENT_OTHER): Payer: Self-pay | Admitting: Emergency Medicine

## 2023-07-07 DIAGNOSIS — K0889 Other specified disorders of teeth and supporting structures: Secondary | ICD-10-CM | POA: Diagnosis present

## 2023-07-07 DIAGNOSIS — K029 Dental caries, unspecified: Secondary | ICD-10-CM | POA: Insufficient documentation

## 2023-07-07 MED ORDER — PENICILLIN V POTASSIUM 500 MG PO TABS: 500.0000 mg | ORAL_TABLET | Freq: Four times a day (QID) | ORAL | 0 refills | Status: AC

## 2023-07-07 MED ORDER — LIDOCAINE VISCOUS HCL 2 % MT SOLN
15.0000 mL | Freq: Once | OROMUCOSAL | Status: AC
  Administered 2023-07-07: 15 mL via OROMUCOSAL
  Filled 2023-07-07: qty 15

## 2023-07-07 MED ORDER — PENICILLIN V POTASSIUM 250 MG PO TABS
500.0000 mg | ORAL_TABLET | Freq: Once | ORAL | Status: AC
  Administered 2023-07-07: 500 mg via ORAL
  Filled 2023-07-07: qty 2

## 2023-07-07 MED ORDER — NAPROXEN 500 MG PO TABS: 500.0000 mg | ORAL_TABLET | Freq: Two times a day (BID) | ORAL | 0 refills | Status: AC

## 2023-07-07 NOTE — ED Triage Notes (Signed)
Pt states think broke a lower right molar, happened 2 days ago, has tried since to get a dental appointment, but was unable.

## 2023-07-07 NOTE — ED Provider Notes (Signed)
[place] EMERGENCY DEPARTMENT AT [facility] Provider Note   CSN: [id number] Arrival date & time: 07/07/23  2319     History  Chief Complaint  Patient presents with   Dental Pain    Johnny Butler is a 32 y.o. male.  The history is provided by the patient.  Dental Pain Location:  Lower Lower teeth location:  32/RL 3rd molar and 31/RL 2nd molar Quality:  Aching Severity:  Severe Onset quality:  Gradual Duration:  2 days Timing:  Constant Chronicity:  New Context: crown fracture and dental caries   Previous work-up:  Dental exam Relieved by:  Nothing Worsened by:  Nothing Associated symptoms: no facial swelling, no fever and no trismus   Risk factors: no diabetes        Home Medications Prior to Admission medications   Medication Sig Start Date End Date Taking? Authorizing Provider  naproxen (NAPROSYN) 500 MG tablet Take 1 tablet (500 mg total) by mouth 2 (two) times daily with a meal. 07/07/23  Yes Shraddha Lebron, MD  penicillin v potassium (VEETID) 500 MG tablet Take 1 tablet (500 mg total) by mouth 4 (four) times daily for 7 days. 07/07/23 07/14/23 Yes Yahsir Wickens, MD  albuterol (PROVENTIL HFA;VENTOLIN HFA) 108 (90 BASE) MCG/ACT inhaler Inhale 1-2 puffs into the lungs every 6 (six) hours as needed for wheezing or shortness of breath. 10/01/13   Elsie Stain E, PA-C  albuterol (PROVENTIL) (2.5 MG/3ML) 0.083% nebulizer solution Take 3 mLs (2.5 mg total) by nebulization every 6 (six) hours as needed for wheezing or shortness of breath. 10/01/13   Elsie Stain E, PA-C  amphetamine-dextroamphetamine (ADDERALL) 10 MG tablet Take 10 mg by mouth 2 (two) times daily.    [provider]  benzonatate (TESSALON) 100 MG capsule Take 1-2 capsules (100-200 mg total) by mouth 3 (three) times daily as needed for cough. 10/01/13   Godfrey Pick, PA-C  HYDROcodone-homatropine (HYCODAN) 5-1.5 MG/5ML syrup Take 5 mLs by mouth at bedtime as needed for cough.  10/01/13   Godfrey Pick, PA-C  oxyCODONE-acetaminophen (PERCOCET/ROXICET) 5-325 MG per tablet Take 1-2 tablets by mouth every 6 (six) hours as needed for severe pain. 11/03/14   Benjiman Core, MD  predniSONE (DELTASONE) 20 MG tablet Take 3 PO QAM x3days, 2 PO QAM x3days, 1 PO QAM x3days 10/01/13   Elsie Stain E, PA-C  sertraline (ZOLOFT) 50 MG tablet Take 50 mg by mouth daily.    [provider]      Allergies    Patient has no known allergies.    Review of Systems   Review of Systems  Constitutional:  Negative for fever.  HENT:  Positive for dental problem. Negative for facial swelling.   All other systems reviewed and are negative.   Physical Exam Updated Vital Signs BP (!) 157/101 (BP Location: Right Arm)   Pulse 100   Temp 98.6 F (37 C) (Oral)   Resp 20   Ht 6\' 3"  (1.905 m)   Wt 136.1 kg   SpO2 97%   BMI 37.50 kg/m  Physical Exam Vitals and nursing note reviewed.  Constitutional:      General: He is not in acute distress.    Appearance: Normal appearance. He is well-developed. He is not diaphoretic.  HENT:     Head: Normocephalic and atraumatic.     Nose: Nose normal.     Mouth/Throat:     Comments: Dental caries RL 3rd molar and wisdom tooth  Eyes:  Conjunctiva/sclera: Conjunctivae normal.     Pupils: Pupils are equal, round, and reactive to light.  Cardiovascular:     Rate and Rhythm: Normal rate and regular rhythm.  Pulmonary:     Effort: Pulmonary effort is normal.     Breath sounds: Normal breath sounds. No wheezing or rales.  Abdominal:     General: Bowel sounds are normal.     Palpations: Abdomen is soft.     Tenderness: There is no abdominal tenderness. There is no guarding or rebound.  Musculoskeletal:        General: Normal range of motion.     Cervical back: Normal range of motion and neck supple.  Skin:    General: Skin is warm and dry.  Neurological:     Mental Status: He is alert and oriented to person, place, and time.      ED Results / Procedures / Treatments   Labs (all labs ordered are listed, but only abnormal results are displayed) Labs Reviewed - No data to display  EKG None  Radiology No results found.  Procedures Procedures    Medications Ordered in ED Medications  penicillin v potassium (VEETID) tablet 500 mg (500 mg Oral Given 07/07/23 2342)  lidocaine (XYLOCAINE) 2 % viscous mouth solution 15 mL (15 mLs Mouth/Throat Given 07/07/23 2342)    ED Course/ Medical Decision Making/ A&P                                 Medical Decision Making Patient with dental pain   Amount and/or Complexity of Data Reviewed External Data Reviewed: notes.    Details: Previous notes reviewed   Risk Prescription drug management. Risk Details: Well appearing, no facial swelling.  Stable for discharge with close follow up.      Final Clinical Impression(s) / ED Diagnoses Final diagnoses:  Pain due to dental caries   Return for intractable cough, coughing up blood, fevers > 100.4 unrelieved by medication, shortness of breath, intractable vomiting, chest pain, shortness of breath, weakness, numbness, changes in speech, facial asymmetry, abdominal pain, passing out, Inability to tolerate liquids or food, cough, altered mental status or any concerns. No signs of systemic illness or infection. The patient is nontoxic-appearing on exam and vital signs are within normal limits.  I have reviewed the triage vital signs and the nursing notes. Pertinent labs & imaging results that were available during my care of the patient were reviewed by me and considered in my medical decision making (see chart for details). After history, exam, and medical workup I feel the patient has been appropriately medically screened and is safe for discharge home. Pertinent diagnoses were discussed with the patient. Patient was given return precautions.    Rx / DC Orders ED Discharge Orders          Ordered    penicillin v  potassium (VEETID) 500 MG tablet  4 times daily        07/07/23 2347    naproxen (NAPROSYN) 500 MG tablet  2 times daily with meals        07/07/23 2347              Lane Kjos, MD 07/07/23 2350
